# Patient Record
Sex: Male | Born: 1965
Health system: Southern US, Community
[De-identification: ages and names within clinical notes are randomized; demographics above are authoritative.]

## PROBLEM LIST (undated history)

## (undated) DIAGNOSIS — G4730 Sleep apnea, unspecified: Secondary | ICD-10-CM

## (undated) DIAGNOSIS — E785 Hyperlipidemia, unspecified: Secondary | ICD-10-CM

## (undated) DIAGNOSIS — I10 Essential (primary) hypertension: Secondary | ICD-10-CM

## (undated) DIAGNOSIS — E079 Disorder of thyroid, unspecified: Secondary | ICD-10-CM

## (undated) HISTORY — DX: Sleep apnea, unspecified: G47.30

## (undated) HISTORY — DX: Essential (primary) hypertension: I10

## (undated) HISTORY — DX: Hyperlipidemia, unspecified: E78.5

---

## 2015-08-06 DIAGNOSIS — G4730 Sleep apnea, unspecified: Secondary | ICD-10-CM

## 2015-08-06 HISTORY — DX: Sleep apnea, unspecified: G47.30

## 2016-04-18 ENCOUNTER — Ambulatory Visit (INDEPENDENT_AMBULATORY_CARE_PROVIDER_SITE_OTHER): Payer: BLUE CROSS/BLUE SHIELD | Admitting: Family Medicine

## 2016-04-18 ENCOUNTER — Encounter: Payer: Self-pay | Admitting: Family Medicine

## 2016-04-18 VITALS — BP 129/73 | HR 73 | Temp 98.7°F | Ht 71.75 in | Wt 228.2 lb

## 2016-04-18 DIAGNOSIS — E291 Testicular hypofunction: Secondary | ICD-10-CM

## 2016-04-18 DIAGNOSIS — I10 Essential (primary) hypertension: Secondary | ICD-10-CM | POA: Diagnosis not present

## 2016-04-18 DIAGNOSIS — E785 Hyperlipidemia, unspecified: Secondary | ICD-10-CM

## 2016-04-18 DIAGNOSIS — R5382 Chronic fatigue, unspecified: Secondary | ICD-10-CM | POA: Diagnosis not present

## 2016-04-18 DIAGNOSIS — R7989 Other specified abnormal findings of blood chemistry: Secondary | ICD-10-CM

## 2016-04-18 NOTE — Progress Notes (Signed)
[facility] [address], [address] [place], [place] [id number] [phone] [phone]  Date:  04/18/2016   Name:  Edward ArrowLarry Vega   DOB:  04/09/1966   MRN:  [id number]  PCP:  Abbe AmsterdamOPLAND,[name], MD    Chief Complaint: Establish Care   History of Present Illness:  Edward Vega is a 50 y.o. very pleasant male patient who presents with the following:  Here today to establish care and discuss his chronic medical issues.  He has a history of low T, HTN, high cholesterol and sleep apnea.   He has not had a PCP really- he would like to establish this.  He would like to schedule a CPE He is struggling with some low T- he is seeing a urologist in [place] about this.  He still feels pretty tired in the afternoons. He notes that he feels like he must take a nap around 2pm daily.  Low Tdiagnosed just about one year ago.  He reports that his T was in the high 100s at dx- androgel brought him to the 500s, then back to 300s, and he is now on the testipel (implant)  He does not snore with the CPAP, and he does feel like he is nice and alert when he wakes up. His pulmonologist reports that his numbers are good here.    Last week he did have a life insurnace physical- they did some labs for him. He will bring this in for me- everything looked good per his report however.  He will send me these results Admits that he is nervous that he could have a heart problem.  He feels like his exercise tolerance may not be as good as in the past, but he does not have any CP or SOB.  He is a pt at [facility] in [place]  There are no active problems to display for this patient.   Past Medical History  Diagnosis Date  . Hypertension   . Hyperlipidemia   . Sleep apnea 08/06/2015    No past surgical history on file.  Social History  Substance Use Topics  . Smoking status: Not on file  . Smokeless tobacco: Not on file  . Alcohol Use: Not on file    Family History   Problem Relation Age of Onset  . Cancer Mother   . Hypertension Father     No Known Allergies  Medication list has been reviewed and updated.  No current outpatient prescriptions on file prior to visit.   No current facility-administered medications on file prior to visit.    Review of Systems:  As per HPI- otherwise negative. He does sleep 7.5 hours a night on average He will generally exercise 2-3 days a week, he enjoys walking or playing golf on foot.  He has started working with a Psychologist, educationaltrainer.     Physical Examination: Filed Vitals:   04/18/16 1435  BP: 129/73  Pulse: 73  Temp: 98.7 F (37.1 C)    Ideal Body Weight:    GEN: WDWN, NAD, Non-toxic, A & O x 3, looks well HEENT: Atraumatic, Normocephalic. Neck supple. No masses, No LAD. Ears and Nose: No external deformity. CV: RRR, No M/G/R. No JVD. No thrill. No extra heart sounds. PULM: CTA B, no wheezes, crackles, rhonchi. No retractions. No resp. distress. No accessory muscle use. ABD: S, NT, ND, +BS. No rebound. No HSM. EXTR: No c/c/e NEURO Normal gait.  PSYCH: Normally interactive. Conversant. Not depressed  or anxious appearing.  Calm demeanor.    Assessment and Plan: Chronic fatigue  Low testosterone  Essential hypertension  Dyslipidemia  Here today to establish care and to discuss a few concerns.  He is feeling tired, his low T has been tricky to treat- he is worried that this may mean there is something more serious wrong with him.  He will get me his recent labs and will schedule a cardiology visit. He is seeing urology next month and will let me know what they saw We plan a CPE in a few months   Signed Abbe Amsterdam, MD

## 2016-04-18 NOTE — Patient Instructions (Signed)
If you would, please send me a copy of your recent labs Please set up your mychart count when you have time for easiest communication Please send me an update when you see your urologist next month Call and arrange a follow-up with your cardiologist- it would be reasonable to consider doing a treadmill stress test for you. Take care and let me know if you have any questions.  Why don't we do a physical for you in 3-4 months

## 2016-04-18 NOTE — Progress Notes (Signed)
Pre visit review using our clinic review tool, if applicable. No additional management support is needed unless otherwise documented below in the visit note. 

## 2017-02-27 ENCOUNTER — Emergency Department (HOSPITAL_BASED_OUTPATIENT_CLINIC_OR_DEPARTMENT_OTHER)
Admission: EM | Admit: 2017-02-27 | Discharge: 2017-02-27 | Disposition: A | Discharge status: Home / Self Care | Payer: 59 | Attending: Emergency Medicine | Admitting: Emergency Medicine

## 2017-02-27 ENCOUNTER — Emergency Department (HOSPITAL_BASED_OUTPATIENT_CLINIC_OR_DEPARTMENT_OTHER): Payer: 59

## 2017-02-27 ENCOUNTER — Encounter (HOSPITAL_BASED_OUTPATIENT_CLINIC_OR_DEPARTMENT_OTHER): Payer: Self-pay | Admitting: Respiratory Therapy

## 2017-02-27 DIAGNOSIS — M25572 Pain in left ankle and joints of left foot: Secondary | ICD-10-CM

## 2017-02-27 DIAGNOSIS — Y9355 Activity, bike riding: Secondary | ICD-10-CM | POA: Diagnosis not present

## 2017-02-27 DIAGNOSIS — Y92410 Unspecified street and highway as the place of occurrence of the external cause: Secondary | ICD-10-CM | POA: Diagnosis not present

## 2017-02-27 DIAGNOSIS — Y999 Unspecified external cause status: Secondary | ICD-10-CM | POA: Insufficient documentation

## 2017-02-27 DIAGNOSIS — M25532 Pain in left wrist: Secondary | ICD-10-CM | POA: Insufficient documentation

## 2017-02-27 DIAGNOSIS — S99912A Unspecified injury of left ankle, initial encounter: Secondary | ICD-10-CM | POA: Diagnosis present

## 2017-02-27 DIAGNOSIS — I10 Essential (primary) hypertension: Secondary | ICD-10-CM | POA: Insufficient documentation

## 2017-02-27 HISTORY — DX: Disorder of thyroid, unspecified: E07.9
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
## 2017-02-27 NOTE — ED Notes (Signed)
Patient transported to X-ray 
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
## 2017-02-27 NOTE — Discharge Instructions (Signed)
Use the ASO brace as needed for comfort. You may take NSAIDs or tylenol every 4-6 hours for pain. Rest, ice, compression, and elevation of the ankle as tolerated. Follow up with sports medicine in 1-2 weeks for re-evaluation of your ankle and wrist pain. If you develop worsening pain, weakness, numbness/tingling, fevers/chills, coordination issues, loss of bowel/bladder control, return to the ED.

## 2018-02-08 ENCOUNTER — Other Ambulatory Visit (HOSPITAL_COMMUNITY): Payer: Self-pay | Admitting: Internal Medicine

## 2018-02-08 DIAGNOSIS — R911 Solitary pulmonary nodule: Secondary | ICD-10-CM

## 2018-02-13 ENCOUNTER — Other Ambulatory Visit: Payer: Self-pay | Admitting: Internal Medicine

## 2018-02-13 DIAGNOSIS — R911 Solitary pulmonary nodule: Secondary | ICD-10-CM

## 2018-02-21 ENCOUNTER — Ambulatory Visit
Admission: RE | Admit: 2018-02-21 | Discharge: 2018-02-21 | Disposition: A | Discharge status: Home / Self Care | Payer: 59 | Source: Ambulatory Visit | Attending: Internal Medicine | Admitting: Internal Medicine

## 2018-02-21 DIAGNOSIS — R911 Solitary pulmonary nodule: Secondary | ICD-10-CM | POA: Diagnosis present

## 2018-02-21 LAB — GLUCOSE, CAPILLARY: Glucose-Capillary: 92 mg/dL (ref 65–99)

## 2018-02-21 MED ORDER — FLUDEOXYGLUCOSE F - 18 (FDG) INJECTION
11.8100 | Freq: Once | INTRAVENOUS | Status: AC | PRN
  Administered 2018-02-21: 11.81 via INTRAVENOUS

## 2018-10-11 ENCOUNTER — Other Ambulatory Visit: Payer: Self-pay | Admitting: "Endocrinology

## 2018-10-11 DIAGNOSIS — E038 Other specified hypothyroidism: Secondary | ICD-10-CM

## 2018-10-11 DIAGNOSIS — E291 Testicular hypofunction: Secondary | ICD-10-CM

## 2018-11-05 ENCOUNTER — Ambulatory Visit
Admission: RE | Admit: 2018-11-05 | Discharge: 2018-11-05 | Disposition: A | Discharge status: Home / Self Care | Payer: BLUE CROSS/BLUE SHIELD | Source: Ambulatory Visit | Attending: "Endocrinology | Admitting: "Endocrinology

## 2018-11-05 DIAGNOSIS — E038 Other specified hypothyroidism: Secondary | ICD-10-CM | POA: Insufficient documentation

## 2018-11-05 DIAGNOSIS — E291 Testicular hypofunction: Secondary | ICD-10-CM

## 2018-11-05 MED ORDER — GADOBUTROL 1 MMOL/ML IV SOLN
9.5000 mL | Freq: Once | INTRAVENOUS | Status: AC | PRN
  Administered 2018-11-05: 9.5 mL via INTRAVENOUS

## 2020-12-30 ENCOUNTER — Other Ambulatory Visit: Payer: Self-pay | Admitting: Student

## 2020-12-30 DIAGNOSIS — M7581 Other shoulder lesions, right shoulder: Secondary | ICD-10-CM

## 2020-12-30 DIAGNOSIS — M19011 Primary osteoarthritis, right shoulder: Secondary | ICD-10-CM

## 2021-01-07 ENCOUNTER — Other Ambulatory Visit: Payer: Self-pay

## 2021-01-07 ENCOUNTER — Ambulatory Visit
Admission: RE | Admit: 2021-01-07 | Discharge: 2021-01-07 | Disposition: A | Discharge status: Home / Self Care | Payer: BC Managed Care – PPO | Source: Ambulatory Visit | Attending: Student | Admitting: Student

## 2021-01-07 DIAGNOSIS — M19011 Primary osteoarthritis, right shoulder: Secondary | ICD-10-CM

## 2021-01-07 DIAGNOSIS — M7581 Other shoulder lesions, right shoulder: Secondary | ICD-10-CM

## 2021-09-05 IMAGING — MR MR SHOULDER*R* W/O CM
4 of 5 series · 30 of 40 positions shown · non-contrast
Comparison: None.

CLINICAL DATA: Right shoulder pain beginning 2 years ago

EXAM:
MRI OF THE RIGHT SHOULDER WITHOUT CONTRAST
TECHNIQUE: Multiplanar, multisequence MR imaging of the shoulder was performed.
No intravenous contrast was administered.

[Series 5: T2 fat-sat · axial · right · 4.0mm · 0.55mm/px · z∈[-70,+45]mm · 8 of 23 slices shown (1 of 3)]
[im 1/23]
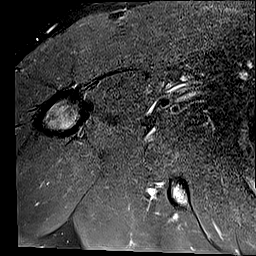
[im 3/23]
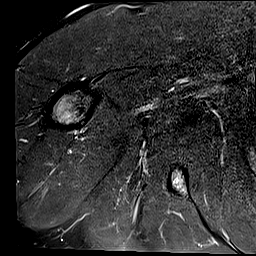
[im 8/23]
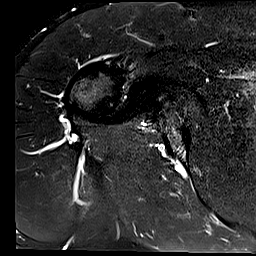
[im 10/23]
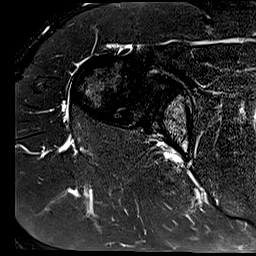
[im 13/23]
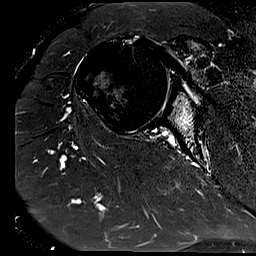
[im 15/23]
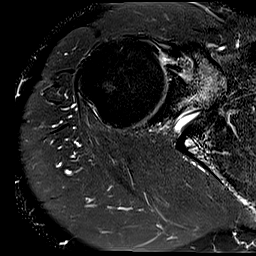
[im 20/23]
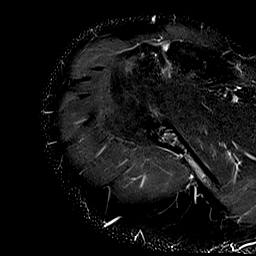
[im 23/23]
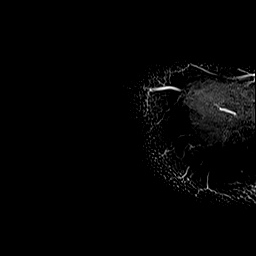

[Series 6: T2 fat-sat · oblique · right · 4.0mm · 0.23mm/px · 8 of 22 slices shown (2 of 3)]
[im 1/22]
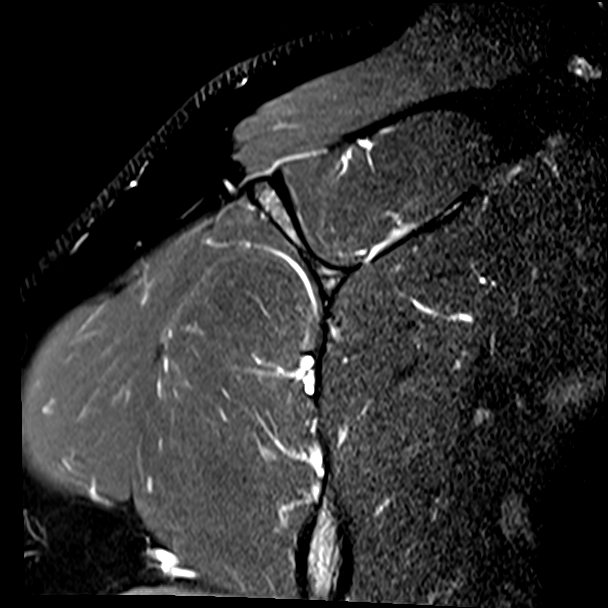
[im 4/22]
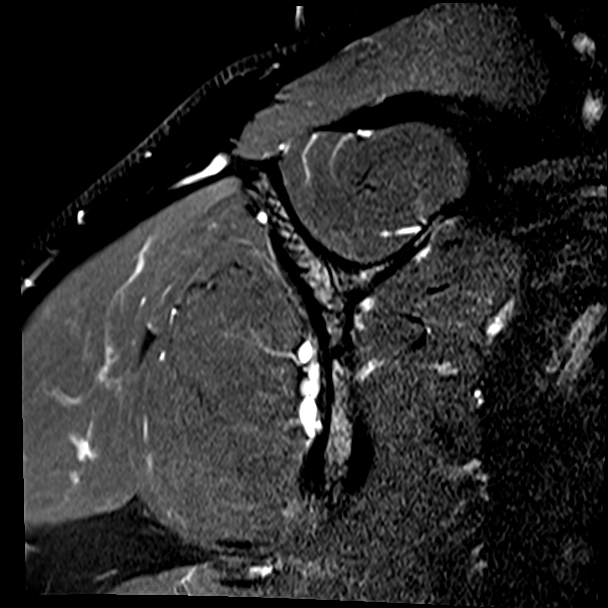
[im 7/22]
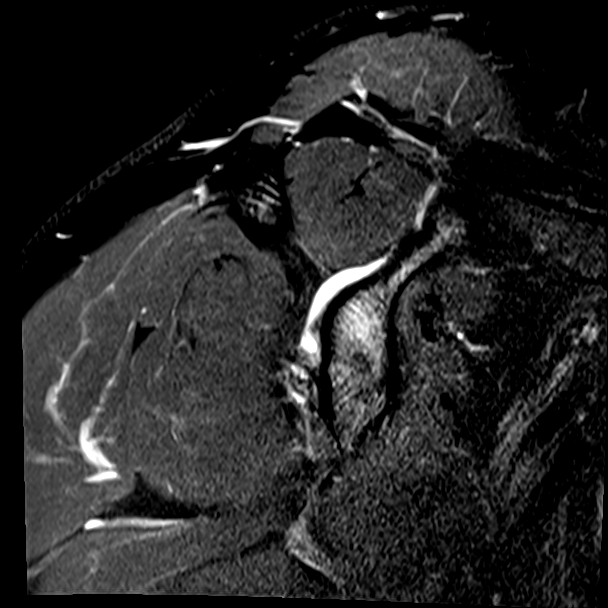
[im 10/22]
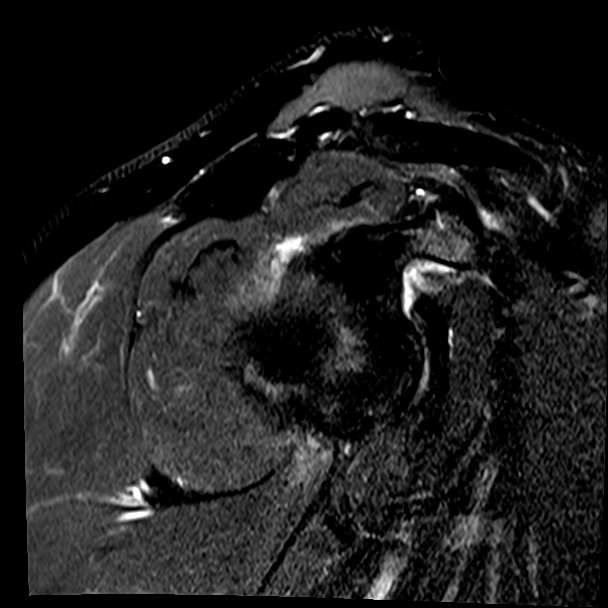
[im 13/22]
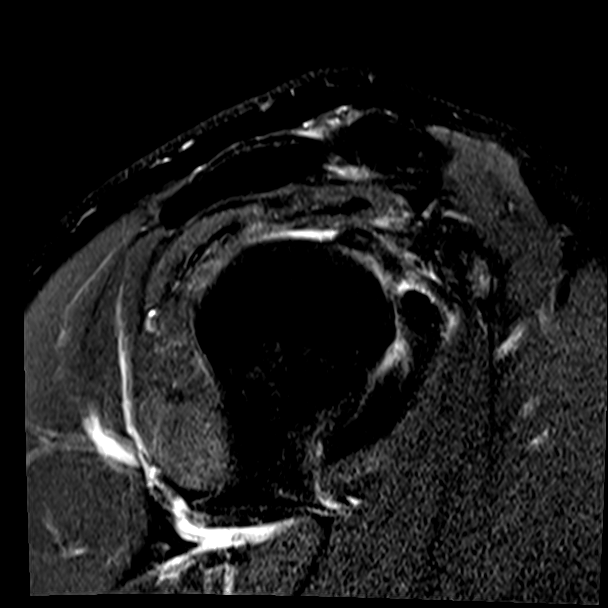
[im 16/22]
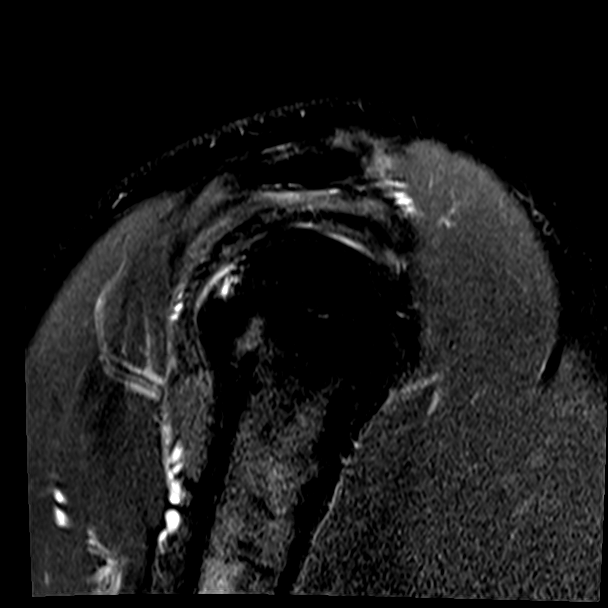
[im 19/22]
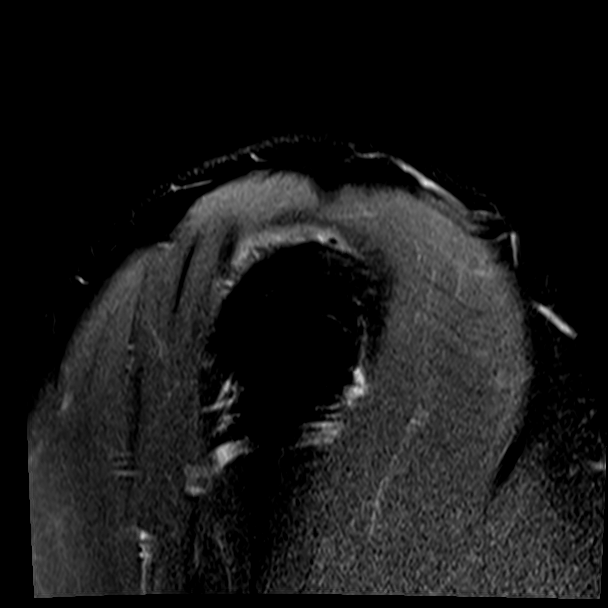
[im 22/22]
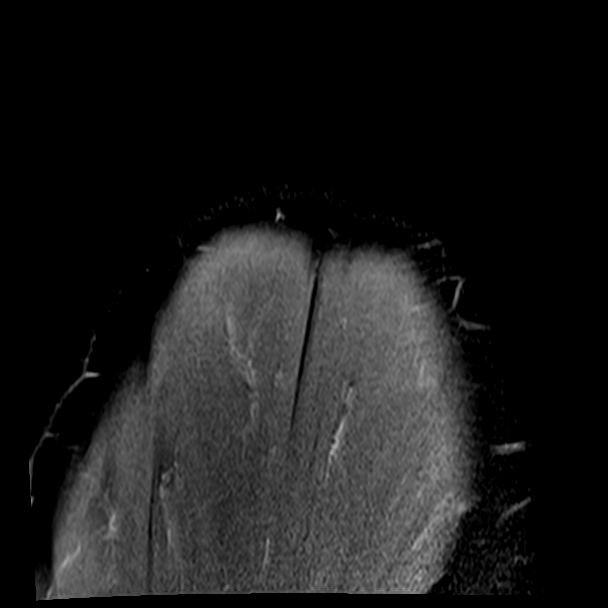

[Series 7: PD · oblique · right · 4.0mm · 0.44mm/px · 7 of 20 slices shown]
[im 1/20]
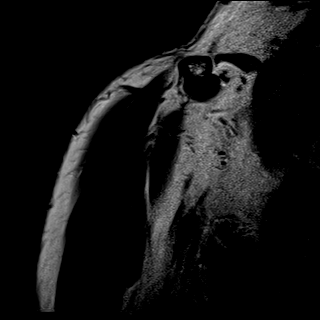
[im 4/20]
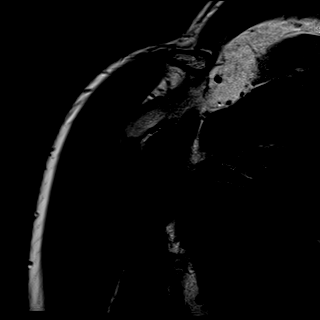
[im 7/20]
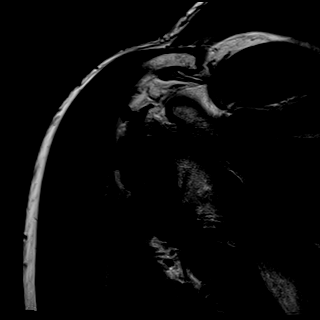
[im 10/20]
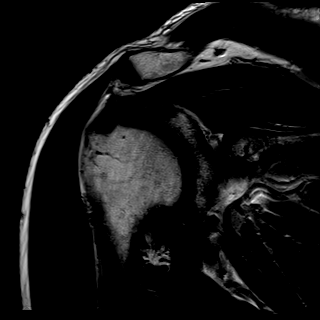
[im 13/20]
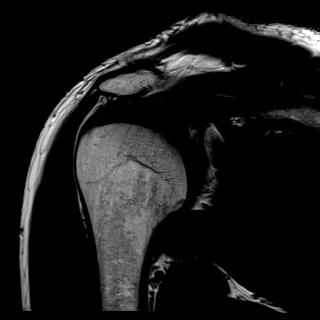
[im 16/20]
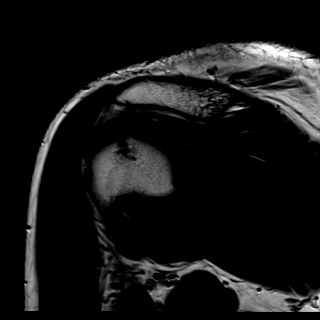
[im 20/20]
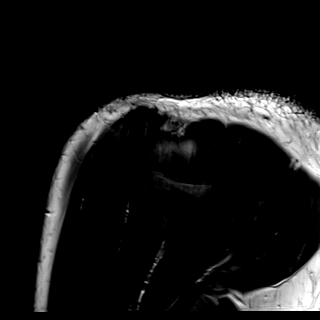

[Series 8: T2 fat-sat · oblique · right · 4.0mm · 0.44mm/px · 7 of 20 slices shown (3 of 3)]
[im 1/20]
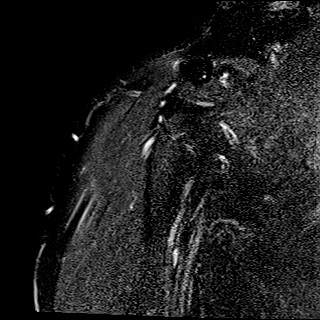
[im 4/20]
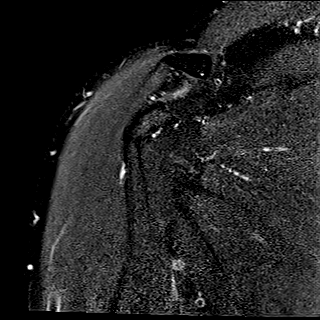
[im 7/20]
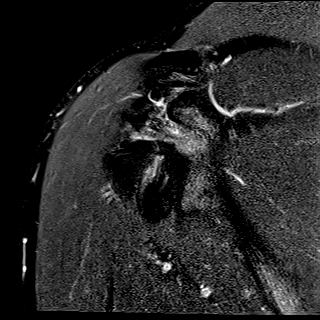
[im 10/20]
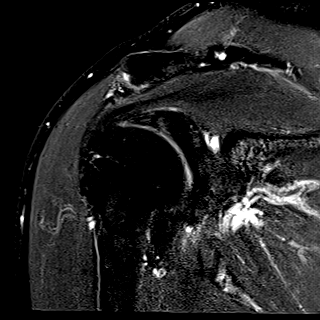
[im 13/20]
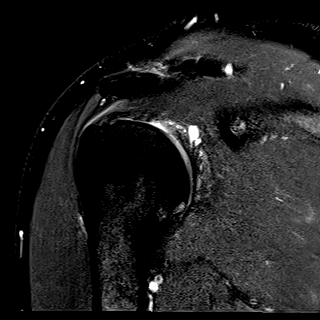
[im 16/20]
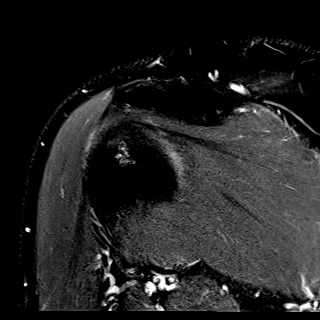
[im 20/20]
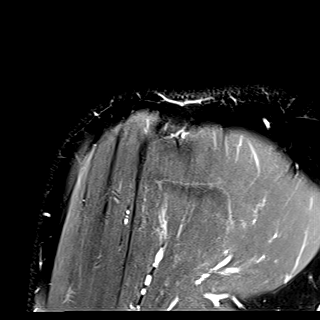

[30 of 40 positions shown; findings below may reference images not displayed]

FINDINGS: Rotator cuff: Mild distal supraspinatus tendinopathy, questionable
punctate calcific component on image 4 series 6. No rotator cuff
tear.

Muscles:  Unremarkable

Biceps long head:  Unremarkable

Acromioclavicular Joint: Mild spurring without significant
surrounding edema. Type I acromion. No significant regional
bursitis.

Glenohumeral Joint: Moderate degenerative chondral thinning with
mild spurring. No joint effusion.

Labrum: 0.8 by 0.4 by 0.7 cm cystic lesion along the peripheral
margin of the posterior labrum compatible with paralabral cyst
associated with otherwise occult posterior labral tear, versus small
ganglion cyst. No secondary signs of suprascapular nerve
impingement. Well-defined surface tear of the labrum is otherwise
not shown.

Bones: No significant extra-articular osseous abnormalities
identified.

Other: No supplemental non-categorized findings.
IMPRESSION: 1. 0.8 by 0.4 by 0.7 cm cystic lesion along the peripheral margin of
the posterior labrum compatible with paralabral cyst associated with
otherwise occult posterior labral tear, versus small ganglion cyst.
No secondary signs of suprascapular nerve impingement.
2. Mild distal supraspinatus tendinopathy, questionable punctate
calcific component. No rotator cuff tear.
3. Moderate degenerative chondral thinning in the glenohumeral
joint. Mild degenerative AC joint arthropathy.
